# Patient Record
Sex: Female | Born: 1965 | Race: Black or African American | Hispanic: No | Marital: Married | Smoking: Never smoker
Health system: Southern US, Community
[De-identification: ages and names within clinical notes are randomized; demographics above are authoritative.]

## PROBLEM LIST (undated history)

## (undated) DIAGNOSIS — H409 Unspecified glaucoma: Secondary | ICD-10-CM

## (undated) DIAGNOSIS — Z90721 Acquired absence of ovaries, unilateral: Secondary | ICD-10-CM

## (undated) DIAGNOSIS — E7800 Pure hypercholesterolemia, unspecified: Secondary | ICD-10-CM

## (undated) DIAGNOSIS — N80129 Deep endometriosis of ovary, unspecified ovary: Secondary | ICD-10-CM

## (undated) DIAGNOSIS — N920 Excessive and frequent menstruation with regular cycle: Secondary | ICD-10-CM

## (undated) DIAGNOSIS — Z9289 Personal history of other medical treatment: Secondary | ICD-10-CM

## (undated) DIAGNOSIS — E669 Obesity, unspecified: Secondary | ICD-10-CM

## (undated) DIAGNOSIS — I10 Essential (primary) hypertension: Secondary | ICD-10-CM

## (undated) DIAGNOSIS — F4321 Adjustment disorder with depressed mood: Secondary | ICD-10-CM

## (undated) DIAGNOSIS — R55 Syncope and collapse: Secondary | ICD-10-CM

## (undated) HISTORY — PX: WISDOM TOOTH EXTRACTION: SHX21

## (undated) HISTORY — DX: Acquired absence of ovaries, unilateral: Z90.721

## (undated) HISTORY — DX: Obesity, unspecified: E66.9

## (undated) HISTORY — DX: Unspecified glaucoma: H40.9

## (undated) HISTORY — DX: Adjustment disorder with depressed mood: F43.21

## (undated) HISTORY — DX: Excessive and frequent menstruation with regular cycle: N92.0

## (undated) HISTORY — PX: TUBAL LIGATION: SHX77

## (undated) HISTORY — PX: TOE SURGERY: SHX1073

## (undated) HISTORY — DX: Pure hypercholesterolemia, unspecified: E78.00

## (undated) HISTORY — DX: Syncope and collapse: R55

## (undated) HISTORY — DX: Deep endometriosis of ovary, unspecified ovary: N80.129

---

## 1990-07-27 DIAGNOSIS — Z9289 Personal history of other medical treatment: Secondary | ICD-10-CM

## 1990-07-27 HISTORY — DX: Personal history of other medical treatment: Z92.89

## 1998-03-15 ENCOUNTER — Encounter: Admission: RE | Admit: 1998-03-15 | Discharge: 1998-03-15 | Payer: Self-pay | Admitting: *Deleted

## 1999-04-04 ENCOUNTER — Other Ambulatory Visit: Admission: RE | Admit: 1999-04-04 | Discharge: 1999-04-04 | Payer: Self-pay | Admitting: Obstetrics & Gynecology

## 1999-04-12 ENCOUNTER — Emergency Department (HOSPITAL_COMMUNITY): Admission: EM | Admit: 1999-04-12 | Discharge: 1999-04-12 | Payer: Self-pay | Admitting: Emergency Medicine

## 2000-04-16 ENCOUNTER — Other Ambulatory Visit: Admission: RE | Admit: 2000-04-16 | Discharge: 2000-04-16 | Payer: Self-pay | Admitting: Obstetrics & Gynecology

## 2001-04-15 ENCOUNTER — Other Ambulatory Visit: Admission: RE | Admit: 2001-04-15 | Discharge: 2001-04-15 | Payer: Self-pay | Admitting: Obstetrics and Gynecology

## 2001-04-18 ENCOUNTER — Other Ambulatory Visit: Admission: RE | Admit: 2001-04-18 | Discharge: 2001-04-18 | Payer: Self-pay | Admitting: Obstetrics and Gynecology

## 2001-09-13 ENCOUNTER — Emergency Department (HOSPITAL_COMMUNITY): Admission: EM | Admit: 2001-09-13 | Discharge: 2001-09-13 | Payer: Self-pay | Admitting: Emergency Medicine

## 2002-05-15 ENCOUNTER — Other Ambulatory Visit: Admission: RE | Admit: 2002-05-15 | Discharge: 2002-05-15 | Payer: Self-pay | Admitting: Obstetrics and Gynecology

## 2002-11-01 ENCOUNTER — Other Ambulatory Visit: Admission: RE | Admit: 2002-11-01 | Discharge: 2002-11-01 | Payer: Self-pay | Admitting: Obstetrics and Gynecology

## 2003-08-14 ENCOUNTER — Other Ambulatory Visit: Admission: RE | Admit: 2003-08-14 | Discharge: 2003-08-14 | Payer: Self-pay | Admitting: Obstetrics and Gynecology

## 2003-12-06 ENCOUNTER — Ambulatory Visit (HOSPITAL_COMMUNITY): Admission: RE | Admit: 2003-12-06 | Discharge: 2003-12-06 | Payer: Self-pay | Admitting: Obstetrics and Gynecology

## 2004-12-29 ENCOUNTER — Other Ambulatory Visit: Admission: RE | Admit: 2004-12-29 | Discharge: 2004-12-29 | Payer: Self-pay | Admitting: Obstetrics and Gynecology

## 2006-02-11 ENCOUNTER — Other Ambulatory Visit: Admission: RE | Admit: 2006-02-11 | Discharge: 2006-02-11 | Payer: Self-pay | Admitting: Obstetrics and Gynecology

## 2013-07-09 DIAGNOSIS — S91309A Unspecified open wound, unspecified foot, initial encounter: Secondary | ICD-10-CM | POA: Insufficient documentation

## 2013-07-09 DIAGNOSIS — Z79899 Other long term (current) drug therapy: Secondary | ICD-10-CM | POA: Insufficient documentation

## 2013-07-09 DIAGNOSIS — Y838 Other surgical procedures as the cause of abnormal reaction of the patient, or of later complication, without mention of misadventure at the time of the procedure: Secondary | ICD-10-CM | POA: Insufficient documentation

## 2013-07-09 DIAGNOSIS — Z792 Long term (current) use of antibiotics: Secondary | ICD-10-CM | POA: Insufficient documentation

## 2013-07-09 DIAGNOSIS — L02619 Cutaneous abscess of unspecified foot: Secondary | ICD-10-CM | POA: Insufficient documentation

## 2013-07-10 ENCOUNTER — Encounter (HOSPITAL_COMMUNITY): Payer: Self-pay | Admitting: Emergency Medicine

## 2013-07-10 ENCOUNTER — Emergency Department (HOSPITAL_COMMUNITY)
Admission: EM | Admit: 2013-07-10 | Discharge: 2013-07-10 | Disposition: A | Discharge status: Home / Self Care | Payer: BC Managed Care – PPO | Attending: Emergency Medicine | Admitting: Emergency Medicine

## 2013-07-10 DIAGNOSIS — L0390 Cellulitis, unspecified: Secondary | ICD-10-CM

## 2013-07-10 MED ORDER — SULFAMETHOXAZOLE-TMP DS 800-160 MG PO TABS: 1.0000 | ORAL_TABLET | Freq: Two times a day (BID) | ORAL | Status: DC

## 2013-07-10 MED ORDER — OXYCODONE-ACETAMINOPHEN 5-325 MG PO TABS
1.0000 | ORAL_TABLET | Freq: Once | ORAL | Status: DC
  Filled 2013-07-10: qty 1

## 2013-07-10 MED ORDER — SULFAMETHOXAZOLE-TMP DS 800-160 MG PO TABS
1.0000 | ORAL_TABLET | Freq: Once | ORAL | Status: AC
  Administered 2013-07-10: 1 via ORAL
  Filled 2013-07-10: qty 1

## 2013-07-10 NOTE — ED Provider Notes (Signed)
CSN: [id number]     Arrival date & time 07/09/13  2309 History   First MD Initiated Contact with Patient 07/10/13 0046     Chief Complaint  Patient presents with  . Post-op Problem   (Consider location/radiation/quality/duration/timing/severity/associated sxs/prior Treatment) HPI Comments: She had an ALT of her right podiatrist place she was put on Vicodin he she states when she took the dressing off from on Friday she noted some blood on the dressing and the wound looked more swollen. Denies any fever myalgias. She states she was not instructed to stay off the foot has been working fine he she doesn't think she can go to work tomorrow due to the pain    The history is provided by the patient.    History reviewed. No pertinent past medical history. History reviewed. No pertinent past surgical history. History reviewed. No pertinent family history. History  Substance Use Topics  . Smoking status: Never Smoker   . Smokeless tobacco: Not on file  . Alcohol Use: No   OB History   Grav Para Term Preterm Abortions TAB SAB Ect Mult Living                 Review of Systems  Constitutional: Negative for fever.  Cardiovascular: Negative for leg swelling.  Musculoskeletal: Negative for joint swelling.  Skin: Positive for wound.  All other systems reviewed and are negative.    Allergies  Review of patient's allergies indicates no known allergies.  Home Medications   Current Outpatient Rx  Name  Route  Sig  Dispense  Refill  . cephALEXin (KEFLEX) 500 MG capsule   Oral   Take 500 mg by mouth 2 (two) times daily.         Marland Kitchen HYDROcodone-acetaminophen (NORCO/VICODIN) 5-325 MG per tablet   Oral   Take 1 tablet by mouth every 6 (six) hours as needed for moderate pain. Do not take with Oxycodone         . oxyCODONE-acetaminophen (PERCOCET) 7.5-325 MG per tablet   Oral   Take 1 tablet by mouth every 6 (six) hours as needed for pain. Do not take with Hydrocodone         .  promethazine (PHENERGAN) 25 MG tablet   Oral   Take 1 tablet by mouth 4 (four) times daily as needed for nausea.          Marland Kitchen sulfamethoxazole-trimethoprim (BACTRIM DS) 800-160 MG per tablet   Oral   Take 1 tablet by mouth 2 (two) times daily.          BP 161/102  Pulse 82  Temp(Src) 98.3 F (36.8 C) (Oral)  Resp 16  SpO2 98%  LMP 07/05/2013 Physical Exam  Nursing note and vitals reviewed. Constitutional: She appears well-developed and well-nourished.  HENT:  Head: Normocephalic.  Eyes: Pupils are equal, round, and reactive to light.  Neck: Normal range of motion.  Cardiovascular: Normal rate.   Pulmonary/Chest: Effort normal.  Musculoskeletal: Normal range of motion. She exhibits edema and tenderness.  Neurological: She is alert.  Skin: Skin is warm. There is erythema.    ED Course  Procedures (including critical care time) Labs Review Labs Reviewed - No data to display Imaging Review No results found.  EKG Interpretation   None      Bedside ultrasound appears to be cellulitis  Will add Septra and to finish Kefex and FU with Podiatrist  MDM  No diagnosis found.        Cipriano Mile  Manus Rudd, NP 07/10/13 0157

## 2013-07-10 NOTE — ED Provider Notes (Signed)
Medical screening examination/treatment/procedure(s) were conducted as a shared visit with non-physician practitioner(s) and myself.  I personally evaluated the patient during the encounter.  EKG Interpretation   None       Pt without signs of persistent abscess on bedside u/s.  Pt does have some cellulitis and feel she need mrsa coverage with bactrim as well as continueing her kelfex  Gwyneth Sprout, MD 07/10/13 [phone]

## 2013-07-10 NOTE — ED Notes (Signed)
Pt arrived to the ED with a complaint of foot pain.  Pt has a cyst lanced on Friday.  Pt states she didn't elevate the foot and continued to use the foot as normal.  Pt noticed pain and swelling earlier on Sunday.

## 2015-10-24 ENCOUNTER — Other Ambulatory Visit: Payer: Self-pay | Admitting: Obstetrics and Gynecology

## 2015-10-24 ENCOUNTER — Other Ambulatory Visit (HOSPITAL_COMMUNITY)
Admission: RE | Admit: 2015-10-24 | Discharge: 2015-10-24 | Disposition: A | Discharge status: Home / Self Care | Payer: BC Managed Care – PPO | Source: Ambulatory Visit | Attending: Obstetrics and Gynecology | Admitting: Obstetrics and Gynecology

## 2015-10-24 DIAGNOSIS — Z1151 Encounter for screening for human papillomavirus (HPV): Secondary | ICD-10-CM | POA: Diagnosis not present

## 2015-10-24 DIAGNOSIS — Z01419 Encounter for gynecological examination (general) (routine) without abnormal findings: Secondary | ICD-10-CM | POA: Insufficient documentation

## 2015-10-28 LAB — CYTOLOGY - PAP

## 2016-01-24 NOTE — Patient Instructions (Addendum)
Your procedure is scheduled on:  Friday, February 07, 2016  Enter through the Main Entrance of [facility] at: 10:00 AM  Pick up the phone at the desk and dial [phone].  Call this number if you have problems the morning of surgery: [phone].  Remember: Do NOT eat food or drink after:  Midnight Thursday, February 06, 2016  Take these medicines the morning of surgery with a SIP OF WATER:  Triamterene  Do NOT wear jewelry (body piercing), metal hair clips/bobby pins, make-up, or nail polish. Do NOT wear lotions, powders, or perfumes.  You may wear deodorant. Do NOT shave for 48 hours prior to surgery. Do NOT bring valuables to the hospital.  Leave suitcase in car.  After surgery it may be brought to your room.  For patients admitted to the hospital, checkout time is 11:00 AM the day of discharge. Home with husband Kazzem cell [phone].

## 2016-01-27 ENCOUNTER — Encounter (HOSPITAL_COMMUNITY): Payer: Self-pay

## 2016-01-27 ENCOUNTER — Encounter (HOSPITAL_COMMUNITY)
Admission: RE | Admit: 2016-01-27 | Discharge: 2016-01-27 | Disposition: A | Discharge status: Home / Self Care | Payer: BC Managed Care – PPO | Source: Ambulatory Visit | Attending: Obstetrics and Gynecology | Admitting: Obstetrics and Gynecology

## 2016-01-27 ENCOUNTER — Other Ambulatory Visit: Payer: Self-pay

## 2016-01-27 DIAGNOSIS — Z01812 Encounter for preprocedural laboratory examination: Secondary | ICD-10-CM | POA: Diagnosis not present

## 2016-01-27 HISTORY — DX: Essential (primary) hypertension: I10

## 2016-01-27 HISTORY — DX: Personal history of other medical treatment: Z92.89

## 2016-01-27 LAB — BASIC METABOLIC PANEL
Anion gap: 7 (ref 5–15)
BUN: 21 mg/dL — AB (ref 6–20)
CALCIUM: 8.9 mg/dL (ref 8.9–10.3)
CO2: 27 mmol/L (ref 22–32)
CREATININE: 0.9 mg/dL (ref 0.44–1.00)
Chloride: 105 mmol/L (ref 101–111)
GFR calc Af Amer: >60 mL/min (ref >60)
GLUCOSE: 118 mg/dL — AB (ref 65–99)
Potassium: 3.2 mmol/L — ABNORMAL LOW (ref 3.5–5.1)
Sodium: 139 mmol/L (ref 135–145)

## 2016-01-27 LAB — CBC
HCT: 37.6 % (ref 36.0–46.0)
Hemoglobin: 12.6 g/dL (ref 12.0–15.0)
MCH: 30.9 pg (ref 26.0–34.0)
MCHC: 33.5 g/dL (ref 30.0–36.0)
MCV: 92.2 fL (ref 78.0–100.0)
PLATELETS: 307 10*3/uL (ref 150–400)
RBC: 4.08 MIL/uL (ref 3.87–5.11)
RDW: 13.4 % (ref 11.5–15.5)
WBC: 6.7 10*3/uL (ref 4.0–10.5)

## 2016-01-27 NOTE — Pre-Procedure Instructions (Signed)
SDS BB History Log given to Lab for previous blood transfusion in 1992, [place], [place] with vaginal delivery.

## 2016-02-07 ENCOUNTER — Ambulatory Visit (HOSPITAL_COMMUNITY): Payer: BC Managed Care – PPO | Admitting: Anesthesiology

## 2016-02-07 ENCOUNTER — Ambulatory Visit (HOSPITAL_COMMUNITY)
Admission: RE | Admit: 2016-02-07 | Discharge: 2016-02-07 | Disposition: A | Discharge status: Home / Self Care | Payer: BC Managed Care – PPO | Source: Ambulatory Visit | Attending: Obstetrics and Gynecology | Admitting: Obstetrics and Gynecology

## 2016-02-07 ENCOUNTER — Encounter (HOSPITAL_COMMUNITY)
Admission: RE | Disposition: A | Discharge status: Home / Self Care | Payer: Self-pay | Source: Ambulatory Visit | Attending: Obstetrics and Gynecology

## 2016-02-07 ENCOUNTER — Encounter (HOSPITAL_COMMUNITY): Payer: Self-pay

## 2016-02-07 DIAGNOSIS — Z6836 Body mass index (BMI) 36.0-36.9, adult: Secondary | ICD-10-CM | POA: Diagnosis not present

## 2016-02-07 DIAGNOSIS — N920 Excessive and frequent menstruation with regular cycle: Secondary | ICD-10-CM | POA: Insufficient documentation

## 2016-02-07 DIAGNOSIS — N801 Endometriosis of ovary: Secondary | ICD-10-CM | POA: Diagnosis not present

## 2016-02-07 DIAGNOSIS — I10 Essential (primary) hypertension: Secondary | ICD-10-CM | POA: Insufficient documentation

## 2016-02-07 DIAGNOSIS — N83201 Unspecified ovarian cyst, right side: Secondary | ICD-10-CM | POA: Insufficient documentation

## 2016-02-07 HISTORY — PX: SALPINGOOPHORECTOMY: SHX82

## 2016-02-07 HISTORY — PX: UNILATERAL SALPINGECTOMY: SHX6160

## 2016-02-07 HISTORY — PX: HYSTEROSCOPY: SHX211

## 2016-02-07 LAB — ABO/RH: ABO/RH(D): O POS

## 2016-02-07 LAB — TYPE AND SCREEN
ABO/RH(D): O POS
Antibody Screen: NEGATIVE

## 2016-02-07 LAB — PREGNANCY, URINE: PREG TEST UR: NEGATIVE

## 2016-02-07 SURGERY — ABLATION, ENDOMETRIUM, HYSTEROSCOPIC
Anesthesia: General | Site: Abdomen | Laterality: Right

## 2016-02-07 MED ORDER — LACTATED RINGERS IV SOLN
INTRAVENOUS | Status: DC
  Administered 2016-02-07: 125 mL/h via INTRAVENOUS
  Administered 2016-02-07 (×2): via INTRAVENOUS

## 2016-02-07 MED ORDER — LIDOCAINE HCL 2 % IJ SOLN
INTRAMUSCULAR | Status: AC
  Filled 2016-02-07: qty 20

## 2016-02-07 MED ORDER — HEPARIN SODIUM (PORCINE) 5000 UNIT/ML IJ SOLN
INTRAMUSCULAR | Status: AC
  Filled 2016-02-07: qty 1

## 2016-02-07 MED ORDER — OXYCODONE-ACETAMINOPHEN 5-325 MG PO TABS
1.0000 | ORAL_TABLET | ORAL | Status: DC | PRN
  Administered 2016-02-07: 1 via ORAL

## 2016-02-07 MED ORDER — IBUPROFEN 600 MG PO TABS: 600.0000 mg | ORAL_TABLET | Freq: Four times a day (QID) | ORAL | Status: AC | PRN

## 2016-02-07 MED ORDER — MIDAZOLAM HCL 2 MG/2ML IJ SOLN
INTRAMUSCULAR | Status: AC
  Filled 2016-02-07: qty 2

## 2016-02-07 MED ORDER — FENTANYL CITRATE (PF) 100 MCG/2ML IJ SOLN
INTRAMUSCULAR | Status: AC
  Filled 2016-02-07: qty 2

## 2016-02-07 MED ORDER — CEFAZOLIN SODIUM-DEXTROSE 2-4 GM/100ML-% IV SOLN
2.0000 g | INTRAVENOUS | Status: AC
  Administered 2016-02-07: 2 g via INTRAVENOUS

## 2016-02-07 MED ORDER — DEXAMETHASONE SODIUM PHOSPHATE 4 MG/ML IJ SOLN
INTRAMUSCULAR | Status: AC
  Filled 2016-02-07: qty 1

## 2016-02-07 MED ORDER — PROMETHAZINE HCL 25 MG/ML IJ SOLN: 6.2500 mg | INTRAMUSCULAR | Status: DC | PRN

## 2016-02-07 MED ORDER — MIDAZOLAM HCL 2 MG/2ML IJ SOLN: 0.5000 mg | Freq: Once | INTRAMUSCULAR | Status: DC | PRN

## 2016-02-07 MED ORDER — PROPOFOL 10 MG/ML IV BOLUS
INTRAVENOUS | Status: DC | PRN
Start: 2016-02-07 — End: 2016-02-07
  Administered 2016-02-07: 200 mg via INTRAVENOUS

## 2016-02-07 MED ORDER — DEXAMETHASONE SODIUM PHOSPHATE 10 MG/ML IJ SOLN
INTRAMUSCULAR | Status: DC | PRN
  Administered 2016-02-07: 4 mg via INTRAVENOUS

## 2016-02-07 MED ORDER — DEXAMETHASONE SODIUM PHOSPHATE 10 MG/ML IJ SOLN
INTRAMUSCULAR | Status: AC
  Filled 2016-02-07: qty 1

## 2016-02-07 MED ORDER — MIDAZOLAM HCL 2 MG/2ML IJ SOLN
INTRAMUSCULAR | Status: DC | PRN
  Administered 2016-02-07: 2 mg via INTRAVENOUS

## 2016-02-07 MED ORDER — ROCURONIUM BROMIDE 100 MG/10ML IV SOLN
INTRAVENOUS | Status: AC
  Filled 2016-02-07: qty 1

## 2016-02-07 MED ORDER — BUPIVACAINE HCL (PF) 0.25 % IJ SOLN
INTRAMUSCULAR | Status: DC | PRN
  Administered 2016-02-07: 28 mL

## 2016-02-07 MED ORDER — FENTANYL CITRATE (PF) 250 MCG/5ML IJ SOLN
INTRAMUSCULAR | Status: AC
  Filled 2016-02-07: qty 5

## 2016-02-07 MED ORDER — NEOSTIGMINE METHYLSULFATE 10 MG/10ML IV SOLN
INTRAVENOUS | Status: DC | PRN
  Administered 2016-02-07: 2 mg via INTRAVENOUS

## 2016-02-07 MED ORDER — ONDANSETRON HCL 4 MG/2ML IJ SOLN
INTRAMUSCULAR | Status: AC
  Filled 2016-02-07: qty 2

## 2016-02-07 MED ORDER — FENTANYL CITRATE (PF) 100 MCG/2ML IJ SOLN
INTRAMUSCULAR | Status: DC | PRN
  Administered 2016-02-07: 50 ug via INTRAVENOUS
  Administered 2016-02-07: 100 ug via INTRAVENOUS
  Administered 2016-02-07 (×2): 50 ug via INTRAVENOUS
  Administered 2016-02-07: 100 ug via INTRAVENOUS

## 2016-02-07 MED ORDER — PHENYLEPHRINE HCL 10 MG/ML IJ SOLN
INTRAMUSCULAR | Status: DC | PRN
  Administered 2016-02-07: 80 ug via INTRAVENOUS

## 2016-02-07 MED ORDER — ONDANSETRON HCL 4 MG/2ML IJ SOLN
INTRAMUSCULAR | Status: DC | PRN
  Administered 2016-02-07: 4 mg via INTRAVENOUS

## 2016-02-07 MED ORDER — SCOPOLAMINE 1 MG/3DAYS TD PT72
MEDICATED_PATCH | TRANSDERMAL | Status: AC
  Administered 2016-02-07: 1.5 mg via TRANSDERMAL
  Filled 2016-02-07: qty 1

## 2016-02-07 MED ORDER — NEOSTIGMINE METHYLSULFATE 10 MG/10ML IV SOLN
INTRAVENOUS | Status: AC
  Filled 2016-02-07: qty 1

## 2016-02-07 MED ORDER — GLYCOPYRROLATE 0.2 MG/ML IJ SOLN
INTRAMUSCULAR | Status: DC | PRN
  Administered 2016-02-07: 0.4 mg via INTRAVENOUS

## 2016-02-07 MED ORDER — SILVER NITRATE-POT NITRATE 75-25 % EX MISC
CUTANEOUS | Status: AC
  Filled 2016-02-07: qty 1

## 2016-02-07 MED ORDER — SODIUM CHLORIDE 0.9 % IJ SOLN
INTRAMUSCULAR | Status: AC
  Filled 2016-02-07: qty 150

## 2016-02-07 MED ORDER — BUPIVACAINE HCL (PF) 0.25 % IJ SOLN
INTRAMUSCULAR | Status: AC
  Filled 2016-02-07: qty 30

## 2016-02-07 MED ORDER — MEPERIDINE HCL 25 MG/ML IJ SOLN: 6.2500 mg | INTRAMUSCULAR | Status: DC | PRN

## 2016-02-07 MED ORDER — OXYCODONE-ACETAMINOPHEN 5-325 MG PO TABS: 1.0000 | ORAL_TABLET | ORAL | Status: AC | PRN

## 2016-02-07 MED ORDER — HEPARIN SODIUM (PORCINE) 5000 UNIT/ML IJ SOLN
INTRAMUSCULAR | Status: DC | PRN
Start: 2016-02-07 — End: 2016-02-07
  Administered 2016-02-07: 5000 [IU]

## 2016-02-07 MED ORDER — LIDOCAINE HCL (CARDIAC) 20 MG/ML IV SOLN
INTRAVENOUS | Status: AC
  Filled 2016-02-07: qty 5

## 2016-02-07 MED ORDER — PROPOFOL 10 MG/ML IV BOLUS
INTRAVENOUS | Status: AC
  Filled 2016-02-07: qty 20

## 2016-02-07 MED ORDER — KETOROLAC TROMETHAMINE 30 MG/ML IJ SOLN
INTRAMUSCULAR | Status: AC
  Filled 2016-02-07: qty 1

## 2016-02-07 MED ORDER — LACTATED RINGERS IR SOLN
Status: DC | PRN
  Administered 2016-02-07: 3000 mL

## 2016-02-07 MED ORDER — VASOPRESSIN 20 UNIT/ML IV SOLN
INTRAVENOUS | Status: AC
  Filled 2016-02-07: qty 1

## 2016-02-07 MED ORDER — ROCURONIUM BROMIDE 100 MG/10ML IV SOLN
INTRAVENOUS | Status: DC | PRN
Start: 2016-02-07 — End: 2016-02-07
  Administered 2016-02-07: 40 mg via INTRAVENOUS
  Administered 2016-02-07 (×2): 10 mg via INTRAVENOUS

## 2016-02-07 MED ORDER — GLYCOPYRROLATE 0.2 MG/ML IJ SOLN
INTRAMUSCULAR | Status: AC
  Filled 2016-02-07: qty 2

## 2016-02-07 MED ORDER — LIDOCAINE HCL 2 % IJ SOLN
INTRAMUSCULAR | Status: DC | PRN
  Administered 2016-02-07: 10 mL

## 2016-02-07 MED ORDER — OXYCODONE-ACETAMINOPHEN 5-325 MG PO TABS
ORAL_TABLET | ORAL | Status: AC
  Filled 2016-02-07: qty 1

## 2016-02-07 MED ORDER — KETOROLAC TROMETHAMINE 30 MG/ML IJ SOLN
INTRAMUSCULAR | Status: DC | PRN
  Administered 2016-02-07: 30 mg via INTRAVENOUS

## 2016-02-07 MED ORDER — HYDROMORPHONE HCL 1 MG/ML IJ SOLN: 0.2500 mg | INTRAMUSCULAR | Status: DC | PRN

## 2016-02-07 MED ORDER — SCOPOLAMINE 1 MG/3DAYS TD PT72
1.0000 | MEDICATED_PATCH | Freq: Once | TRANSDERMAL | Status: DC
  Administered 2016-02-07: 1.5 mg via TRANSDERMAL

## 2016-02-07 MED ORDER — LIDOCAINE HCL (CARDIAC) 20 MG/ML IV SOLN
INTRAVENOUS | Status: DC | PRN
  Administered 2016-02-07: 60 mg via INTRAVENOUS

## 2016-02-07 SURGICAL SUPPLY — 59 items
APL SKNCLS STERI-STRIP NONHPOA (GAUZE/BANDAGES/DRESSINGS) ×2
APL SRG 38 LTWT LNG FL B (MISCELLANEOUS) ×3
APPLICATOR ARISTA FLEXITIP XL (MISCELLANEOUS) ×5 IMPLANT
BARRIER ADHS 3X4 INTERCEED (GAUZE/BANDAGES/DRESSINGS) IMPLANT
BENZOIN TINCTURE PRP APPL 2/3 (GAUZE/BANDAGES/DRESSINGS) ×5 IMPLANT
BRR ADH 4X3 ABS CNTRL BYND (GAUZE/BANDAGES/DRESSINGS)
CANISTER SUCT 3000ML (MISCELLANEOUS) ×5 IMPLANT
CATH ROBINSON RED A/P 16FR (CATHETERS) IMPLANT
CLOTH BEACON ORANGE TIMEOUT ST (SAFETY) ×5 IMPLANT
CONT PATH 16OZ SNAP LID 3702 (MISCELLANEOUS) ×5 IMPLANT
CONTAINER PREFILL 10% NBF 60ML (FORM) ×5 IMPLANT
COVER BACK TABLE 60X90IN (DRAPES) ×5 IMPLANT
DECANTER SPIKE VIAL GLASS SM (MISCELLANEOUS) ×10 IMPLANT
DILATOR CANAL MILEX (MISCELLANEOUS) IMPLANT
DRSG COVADERM PLUS 2X2 (GAUZE/BANDAGES/DRESSINGS) ×10 IMPLANT
DRSG OPSITE POSTOP 3X4 (GAUZE/BANDAGES/DRESSINGS) ×10 IMPLANT
DURAPREP 26ML APPLICATOR (WOUND CARE) ×5 IMPLANT
ELECT LIGASURE LONG (ELECTRODE) IMPLANT
ELECT LIGASURE SHORT 9 REUSE (ELECTRODE) IMPLANT
ELECT REM PT RETURN 9FT ADLT (ELECTROSURGICAL)
ELECTRODE REM PT RTRN 9FT ADLT (ELECTROSURGICAL) IMPLANT
GLOVE BIO SURGEON STRL SZ 6.5 (GLOVE) ×16 IMPLANT
GLOVE BIO SURGEON STRL SZ7 (GLOVE) ×10 IMPLANT
GLOVE BIO SURGEONS STRL SZ 6.5 (GLOVE) ×4
GLOVE BIOGEL PI IND STRL 7.0 (GLOVE) ×15 IMPLANT
GLOVE BIOGEL PI IND STRL 7.5 (GLOVE) ×18 IMPLANT
GLOVE BIOGEL PI INDICATOR 7.0 (GLOVE) ×10
GLOVE BIOGEL PI INDICATOR 7.5 (GLOVE) ×12
GLOVE ECLIPSE 6.0 STRL STRAW (GLOVE) ×5 IMPLANT
GLOVE SURG SS PI 7.0 STRL IVOR (GLOVE) ×20 IMPLANT
GOWN STRL REUS W/TWL LRG LVL3 (GOWN DISPOSABLE) ×10 IMPLANT
HEMOSTAT ARISTA ABSORB 3G PWDR (MISCELLANEOUS) ×5 IMPLANT
LEGGING LITHOTOMY PAIR STRL (DRAPES) ×5 IMPLANT
LIQUID BAND (GAUZE/BANDAGES/DRESSINGS) ×5 IMPLANT
NS IRRIG 1000ML POUR BTL (IV SOLUTION) ×5 IMPLANT
PACK LAVH (CUSTOM PROCEDURE TRAY) ×5 IMPLANT
PACK ROBOTIC GOWN (GOWN DISPOSABLE) ×5 IMPLANT
PACK VAGINAL MINOR WOMEN LF (CUSTOM PROCEDURE TRAY) ×5 IMPLANT
PAD OB MATERNITY 4.3X12.25 (PERSONAL CARE ITEMS) ×10 IMPLANT
PAD TRENDELENBURG POSITION (MISCELLANEOUS) ×5 IMPLANT
SET GENESYS HTA PROCERVA (MISCELLANEOUS) ×5 IMPLANT
SET IRRIG TUBING LAPAROSCOPIC (IRRIGATION / IRRIGATOR) ×5 IMPLANT
SHEARS HARMONIC ACE PLUS 36CM (ENDOMECHANICALS) ×5 IMPLANT
SLEEVE XCEL OPT CAN 5 100 (ENDOMECHANICALS) ×10 IMPLANT
SUT CHROMIC 2 0 SH (SUTURE) IMPLANT
SUT CHROMIC 2 0 UR 5 27 (SUTURE) IMPLANT
SUT MNCRL AB 4-0 PS2 18 (SUTURE) ×5 IMPLANT
SUT MON AB 4-0 PS1 27 (SUTURE) ×5 IMPLANT
SUT VIC AB 0 CT1 18XCR BRD8 (SUTURE) IMPLANT
SUT VIC AB 0 CT1 36 (SUTURE) IMPLANT
SUT VIC AB 0 CT1 8-18 (SUTURE)
SUT VIC AB 2-0 CT1 (SUTURE) IMPLANT
SUT VICRYL 0 UR6 27IN ABS (SUTURE) ×10 IMPLANT
SUT VICRYL 1 TIES 12X18 (SUTURE) IMPLANT
SYR TB 1ML 25GX5/8 (SYRINGE) ×5 IMPLANT
TOWEL OR 17X24 6PK STRL BLUE (TOWEL DISPOSABLE) ×10 IMPLANT
TRAY FOLEY CATH SILVER 14FR (SET/KITS/TRAYS/PACK) ×5 IMPLANT
WARMER LAPAROSCOPE (MISCELLANEOUS) IMPLANT
WATER STERILE IRR 1000ML POUR (IV SOLUTION) ×5 IMPLANT

## 2016-02-07 NOTE — Transfer of Care (Signed)
Immediate Anesthesia Transfer of Care Note  Patient: Denise Duarte  Procedure(s) Performed: Procedure(s) with comments: HYSTEROSCOPY WITH HYDROTHERMAL ABLATION (N/A) - IF FROZEN COMES BACK WITH CANCER WILL DO LAVH IF NOT HYST.HTA RIGHT SALPINGO OOPHORECTOMY WITH FROZEN SECTION (Right) UNILATERAL SALPINGECTOMY (Left)  Patient Location: PACU  Anesthesia Type:General  Level of Consciousness: awake, alert , oriented and patient cooperative  Airway & Oxygen Therapy: Patient Spontanous Breathing and Patient connected to nasal cannula oxygen  Post-op Assessment: Report given to RN and Post -op Vital signs reviewed and stable  Post vital signs: Reviewed and stable  Last Vitals:  TEMP 99.1 BP 129/84 HR 79 RR 18 POX 98  Last Pain: There were no vitals filed for this visit.    Patients Stated Pain Goal: 1 (02/07/16 1012)  Complications: No apparent anesthesia complications

## 2016-02-07 NOTE — Discharge Instructions (Addendum)
Diagnostic Laparoscopy A diagnostic laparoscopy is a procedure to diagnose diseases in the abdomen. During the procedure, a thin, lighted, pencil-sized instrument called a laparoscope is inserted into the abdomen through an incision. The laparoscope allows your health care provider to look at the organs inside your body. LET [facility] CARE PROVIDER KNOW ABOUT:  Any allergies you have.  All medicines you are taking, including vitamins, herbs, eye drops, creams, and over-the-counter medicines.  Previous problems you or members of your family have had with the use of anesthetics.  Any blood disorders you have.  Previous surgeries you have had.  Medical conditions you have. RISKS AND COMPLICATIONS  Generally, this is a safe procedure. However, problems can occur, which may include:  Infection.  Bleeding.  Damage to other organs.  Allergic reaction to the anesthetics used during the procedure. BEFORE THE PROCEDURE  Do not eat or drink anything after midnight on the night before the procedure or as directed by your health care provider.  Ask your health care provider about:  Changing or stopping your regular medicines.  Taking medicines such as aspirin and ibuprofen. These medicines can thin your blood. Do not take these medicines before your procedure if your health care provider instructs you not to.  Plan to have someone take you home after the procedure. PROCEDURE  You may be given a medicine to help you relax (sedative).  You will be given a medicine to make you sleep (general anesthetic).  Your abdomen will be inflated with a gas. This will make your organs easier to see.  Small incisions will be made in your abdomen.  A laparoscope and other small instruments will be inserted into the abdomen through the incisions.  A tissue sample may be removed from an organ in the abdomen for examination.  The instruments will be removed from the abdomen.  The gas will be  released.  The incisions will be closed with stitches (sutures). AFTER THE PROCEDURE  Your blood pressure, heart rate, breathing rate, and blood oxygen level will be monitored often until the medicines you were given have worn off.   This information is not intended to replace advice given to you by your health care provider. Make sure you discuss any questions you have with your health care provider.   Document Released: 10/19/2000 Document Revised: 04/03/2015 Document Reviewed: 02/23/2014 Elsevier Interactive Patient Education 2016 Elsevier Inc[employer]  Endometrial Ablation Endometrial ablation removes the lining of the uterus (endometrium). It is usually a same-day, outpatient treatment. Ablation helps avoid major surgery, such as surgery to remove the cervix and uterus (hysterectomy). After endometrial ablation, you will have little or no menstrual bleeding and may not be able to have children. However, if you are premenopausal, you will need to use a reliable method of birth control following the procedure because of the small chance that pregnancy can occur. There are different reasons to have this procedure. These reasons include:  Heavy periods.  Bleeding that is causing anemia.  Irregular bleeding.  Bleeding fibroids on the lining inside the uterus if they are smaller than 3 centimeters. This procedure may not be possible for you if:   You want to have children in the future.   You have severe cramps with your menstrual period.   You have precancerous or cancerous cells in your uterus.   You were recently pregnant.   You have gone through menopause.   You have had major surgery on your uterus, resulting in thinning of the uterine  wall. Surgeries may include:  The removal of one or more uterine fibroids (myomectomy).  A cesarean section with a classic (vertical) incision on your uterus. Ask your health care provider what type of cesarean you had. Sometimes the scar on  your skin is different than the scar on your uterus. Even if you have had surgery on your uterus, certain types of ablation may still be safe for you. Talk with your health care provider. [facility] KNOW ABOUT:  Any allergies you have.  All medicines you are taking, including vitamins, herbs, eye drops, creams, and over-the-counter medicines.  Previous problems you or members of your family have had with the use of anesthetics.  Any blood disorders you have.  Previous surgeries you have had.  Medical conditions you have. RISKS AND COMPLICATIONS  Generally, this is a safe procedure. However, as with any procedure, complications can occur. Possible complications include:  Perforation of the uterus.  Bleeding.  Infection of the uterus, bladder, or vagina.  Injury to surrounding organs.  An air bubble to the lung (air embolus).  Pregnancy following the procedure.  Failure of the procedure to help the problem, requiring hysterectomy.  Decreased ability to diagnose cancer in the lining of the uterus. BEFORE THE PROCEDURE  The lining of the uterus must be tested to make sure there is no pre-cancerous or cancer cells present.  An ultrasound may be performed to look at the size of the uterus and to check for abnormalities.  Medicines may be given to thin the lining of the uterus. PROCEDURE  During the procedure, your health care provider will use a tool called a resectoscope to help see inside your uterus. There are different ways to remove the lining of your uterus.   Radiofrequency - This method uses a radiofrequency-alternating electric current to remove the lining of the uterus.  Cryotherapy - This method uses extreme cold to freeze the lining of the uterus.  Heated-Free Liquid - This method uses heated salt (saline) solution to remove the lining of the uterus.  Microwave - This method uses high-energy microwaves to heat up the lining of the uterus to  remove it.  Thermal balloon - This method involves inserting a catheter with a balloon tip into the uterus. The balloon tip is filled with heated fluid to remove the lining of the uterus. AFTER THE PROCEDURE  After your procedure, do not have sexual intercourse or insert anything into your vagina until permitted by your health care provider. After the procedure, you may experience:  Cramps.  Vaginal discharge.  Frequent urination.   This information is not intended to replace advice given to you by your health care provider. Make sure you discuss any questions you have with your health care provider.   Document Released: 05/22/2004 Document Revised: 04/03/2015 Document Reviewed: 12/14/2012 Elsevier Interactive Patient Education 2016 Elsevier Inc.  DISCHARGE INSTRUCTIONS: Laparoscopy  The following instructions have been prepared to help you care for yourself upon your return home today.  Wound care:  Do not get the incision wet for the first 24 hours. The incision should be kept clean and dry.  The Band-Aids or dressings may be removed the day after surgery.  Should the incision become sore, red, and swollen after the first week, check with your doctor.  Personal hygiene:  Shower the day after your procedure.  Activity and limitations:  Do NOT drive or operate any equipment today.  Do NOT lift anything more than 15 pounds for  2-3 weeks after surgery.  Do NOT rest in bed all day.  Walking is encouraged. Walk each day, starting slowly with 5-minute walks 3 or 4 times a day. Slowly increase the length of your walks.  Walk up and down stairs slowly.  Do NOT do strenuous activities, such as golfing, playing tennis, bowling, running, biking, weight lifting, gardening, mowing, or vacuuming for 2-4 weeks. Ask your doctor when it is okay to start.  Diet: Eat a light meal as desired this evening. You may resume your usual diet tomorrow.  Return to work: This is dependent on  the type of work you do. For the most part you can return to a desk job within a week of surgery. If you are more active at work, please discuss this with your doctor.  What to expect after your surgery: You may have a slight burning sensation when you urinate on the first day. You may have a very small amount of blood in the urine. Expect to have a small amount of vaginal discharge/light bleeding for 1-2 weeks. It is not unusual to have abdominal soreness and bruising for up to 2 weeks. You may be tired and need more rest for about 1 week. You may experience shoulder pain for 24-72 hours. Lying flat in bed may relieve it.  Call your doctor for any of the following:  Develop a fever of 100.4 or greater  Inability to urinate 6 hours after discharge from hospital  Severe pain not relieved by pain medications  Persistent of heavy bleeding at incision site  Redness or swelling around incision site after a week  Increasing nausea or vomiting  Patient Signature________________________________________ Nurse Signature_________________________________________

## 2016-02-07 NOTE — H&P (Signed)
History of Present Illness  General:  Pt with persistant right ovarian complex cyst. Elevated Ca-125 has increased slightly. ORCA score is low risk for malignancy. Pt has some mild discomfort in RLQ. Pt has a pulling sensation. Pt also has a h/o menorrhagia and has 2 small fibroids. She was originally consented for hysterectomy but does not want to have anything taken out that she does not have to. Agreeable to endometrial ablation if cyst is benign.   Current Medications  Taking   Biotin 2500 MCG Capsule 1 tablet Orally Once a day   Triamterene-HCTZ 37.5-25 MG Tablet 1 tablet Orally Once a day for BP   K-Dur 20 meq Tablet 1 tablet Orally once a day   Not-Taking/PRN   Lysteda(Tranexamic Acid) 650 MG Tablet 2 tablets Orally TID up to 5 days with menses   Medication List reviewed and reconciled with the patient    Past Medical History  Hypertension (2016)- Dr. Salena Saner.           Surgical History  toe surgery to remove bones from 2 tones on right foot-[facility] 06/2014  BTL    Family History  Father: alive, myocardial infarction, diagnosed with HTN  Mother: alive, hypercholesterolemia, glaucoma, diagnosed with HTN  Paternal Grand Mother: deceased, Breast cancer  Maternal Grand Mother: alive, Breast cancer   Social History  General:  Tobacco use  cigarettes: Never smoked Tobacco history last updated 01/29/2016 no EXPOSURE TO PASSIVE SMOKE.  no Recreational drug use.  Exercise: yes, bikes, regularly.  Marital Status: married.  Children: 2.    Gyn History  Sexual activity currently sexually active.  Periods : every month.  LMP 01/20/16.  Birth control BTL.  Last pap smear date 10/24/15, all negative.  Last mammogram date 05/2015.  Abnormal pap smear assessed with colposcopy, years ago.  Denies H/O STD.    OB History  Number of pregnancies 2.  Pregnancy # 1 live birth, vaginal delivery, 6 lb oz.  Pregnancy # 2 live birth, vaginal delivery, 9 lbs 1 oz.     Allergies  Zinc: swelling: Allergy  sulfa: swelling: Allergy  Lisinopril: cough (11/2014)   Hospitalization/Major Diagnostic Procedure  Child birth x 2    Review of Systems  Denies fever/chills, chest pain, SOB, headaches, numbness/tingling. No h/o complication with anesthesia, bleeding disorders or blood clots.   Vital Signs  Wt 198, Wt change 2 lb, Ht 62, BMI 36.21, Pulse sitting 74, BP sitting 131/90.   Physical Examination  GENERAL:  Patient appears alert and oriented.  General Appearance: well-appearing, well-developed, no acute distress.  Speech: clear.  LUNGS:  Auscultation: no wheezing/rhonchi/rales. CTA bilaterally.  HEART:  Heart sounds: normal. RRR. no murmur.  ABDOMEN:  General: soft nontender, nondistended, no masses.  FEMALE GENITOURINARY:  Pelvic Not examined.  EXTREMITIES:  General: No edema or calf tenderness.     Assessments   1. Pre-operative clearance - Z01.818 (Primary)   2. Complex cyst of right ovary - N83.291   3. Menorrhagia with regular cycle - N92.0   Treatment  1. Complex cyst of right ovary  Notes: Plan for laparoscopic RSO, frozen section and endometrial ablation if benign. Pt is aware that TAH and LSO will be performed in the event of malignancy. Pt is also aware that there may be a need for a second surgery to remove lymph nodes for surgical staging by gyn oncology. Pt was informed that diagnosis may be benign at time of surgery but may change when a full evalution of  ovary is completed. Pt would like least intervention as possible. Pt informed of risk of bleeding, infection and injury to organs in pelvis.    Follow Up  2 Weeks post op

## 2016-02-07 NOTE — Brief Op Note (Signed)
02/07/2016  2:08 PM  PATIENT:  Denise Duarte  50 y.o. female  PRE-OPERATIVE DIAGNOSIS:  Complex Right Ovarian Cyst, Menorrhagia  POST-OPERATIVE DIAGNOSIS:  endometrioma  PROCEDURE:  Procedure(s) with comments: HYSTEROSCOPY WITH HYDROTHERMAL ABLATION (N/A) - IF FROZEN COMES BACK WITH CANCER WILL DO LAVH IF NOT HYST.HTA RIGHT SALPINGO OOPHORECTOMY WITH FROZEN SECTION (Right) UNILATERAL SALPINGECTOMY (Left)  Dilation and currettage  SURGEON:  Surgeon(s) and Role:    * Geryl RankinsEvelyn Jaquanna Ballentine, MD - Primary    * Myna HidalgoJennifer Ozan, DO - Assisting  PHYSICIAN ASSISTANT: n/a  ASSISTANTS: Technician   ANESTHESIA:   local and general  EBL:  Total I/O In: 1000 [I.V.:1000] Out: 475 [Urine:350; Blood:125]  BLOOD ADMINISTERED:none  DRAINS: Urinary Catheter (Foley)   LOCAL MEDICATIONS USED:  2% LIDOCAINE  and Amount: 10 ml  SPECIMEN:  Source of Specimen:  Right complex cyst and tube, left fallopian tube, endometrial currettings, pelvic washings.  DISPOSITION OF SPECIMEN:  PATHOLOGY  COUNTS:  YES  TOURNIQUET:  * No tourniquets in log *  DICTATION: .Other Dictation: Dictation Number [id number]  PLAN OF CARE: Discharge to home after PACU  PATIENT DISPOSITION:  PACU - hemodynamically stable.   Delay start of Pharmacological VTE agent (>24hrs) due to surgical blood loss or risk of bleeding: yes

## 2016-02-07 NOTE — OR Nursing (Signed)
Dr. Luisa HartPatrick called to OR 4 with pathology report from frozen specimen.

## 2016-02-07 NOTE — Anesthesia Preprocedure Evaluation (Addendum)
Anesthesia Evaluation  Patient identified by MRN, date of birth, ID band Patient awake    Reviewed: Allergy & Precautions, NPO status , Patient's Chart, lab work & pertinent test results  History of Anesthesia Complications Negative for: history of anesthetic complications  Airway Mallampati: II  TM Distance: >3 FB Neck ROM: Full    Dental  (+) Dental Advisory Given   Pulmonary neg pulmonary ROS,    breath sounds clear to auscultation       Cardiovascular hypertension, Pt. on medications (-) angina Rhythm:Regular Rate:Normal     Neuro/Psych negative neurological ROS     GI/Hepatic negative GI ROS, Neg liver ROS,   Endo/Other  Morbid obesity  Renal/GU negative Renal ROS     Musculoskeletal negative musculoskeletal ROS (+)   Abdominal (+) + obese,   Peds  Hematology negative hematology ROS (+)   Anesthesia Other Findings   Reproductive/Obstetrics S/p BTL                            Anesthesia Physical Anesthesia Plan  ASA: II  Anesthesia Plan: General   Post-op Pain Management:    Induction: Intravenous  Airway Management Planned: Oral ETT  Additional Equipment:   Intra-op Plan:   Post-operative Plan: Extubation in OR  Informed Consent: I have reviewed the patients History and Physical, chart, labs and discussed the procedure including the risks, benefits and alternatives for the proposed anesthesia with the patient or authorized representative who has indicated his/her understanding and acceptance.   Dental advisory given  Plan Discussed with: CRNA and Surgeon  Anesthesia Plan Comments: (Plan routine monitors, GETA)        Anesthesia Quick Evaluation

## 2016-02-07 NOTE — Anesthesia Procedure Notes (Signed)
Procedure Name: Intubation Date/Time: 02/07/2016 11:49 AM Performed by: Shanon PayorGREGORY, Rissie Sculley M Pre-anesthesia Checklist: Patient identified, Emergency Drugs available, Suction available, Patient being monitored and Timeout performed Patient Re-evaluated:Patient Re-evaluated prior to inductionOxygen Delivery Method: Circle system utilized Preoxygenation: Pre-oxygenation with 100% oxygen Intubation Type: IV induction Ventilation: Mask ventilation without difficulty Laryngoscope Size: Mac and 3 Grade View: Grade II Tube type: Oral Tube size: 7.0 mm Number of attempts: 1 Airway Equipment and Method: Stylet Placement Confirmation: ETT inserted through vocal cords under direct vision,  breath sounds checked- equal and bilateral and positive ETCO2 Secured at: 22 cm Tube secured with: Tape Dental Injury: Teeth and Oropharynx as per pre-operative assessment

## 2016-02-07 NOTE — Anesthesia Postprocedure Evaluation (Signed)
Anesthesia Post Note  Patient: Denise Duarte  Procedure(s) Performed: Procedure(s) (LRB): HYSTEROSCOPY WITH HYDROTHERMAL ABLATION (N/A) RIGHT SALPINGO OOPHORECTOMY WITH FROZEN SECTION (Right) UNILATERAL SALPINGECTOMY (Left)  Patient location during evaluation: PACU Anesthesia Type: General Level of consciousness: awake and alert, oriented and patient cooperative Pain management: pain level controlled Vital Signs Assessment: post-procedure vital signs reviewed and stable Respiratory status: spontaneous breathing, nonlabored ventilation and respiratory function stable Cardiovascular status: blood pressure returned to baseline and stable Postop Assessment: no signs of nausea or vomiting Anesthetic complications: no     Last Vitals:  Filed Vitals:   02/07/16 1012  BP: 143/98  Pulse: 76  Temp: 36.7 C  Resp: 18    Last Pain: There were no vitals filed for this visit. Pain Goal: Patients Stated Pain Goal: 1 (02/07/16 1012)               Mickayla Trouten,E. Basem Yannuzzi

## 2016-02-10 ENCOUNTER — Encounter (HOSPITAL_COMMUNITY): Payer: Self-pay | Admitting: Obstetrics and Gynecology

## 2016-02-12 NOTE — Op Note (Signed)
NAMEFRANK, Denise Duarte NO.:  [id number]  MEDICAL RECORD NO.:  [id number]  LOCATION:  [facility]                          FACILITY:  [facility]  PHYSICIAN:  Denise Partridge, MD   DATE OF BIRTH:  Aug 04, 1965  DATE OF PROCEDURE:  02/07/2016 DATE OF DISCHARGE:  02/07/2016                              OPERATIVE REPORT   PREOPERATIVE DIAGNOSES:  Complex right ovarian cyst.  Menorrhagia and fibroids.  POSTOPERATIVE DIAGNOSIS:  Likely endometrioma.  PROCEDURE:  Right salpingo-oophorectomy, left salpingectomy, and endometrial ablation via HTA via hysteroscopy, frozen-section, dilation and curettage  SURGEON:  Denise Gobert B. Dion Body, MD  ASSISTANT:  Dr. Myna Duarte, which she was requested due to suspected complexity of the case and adhesions and anticipated LAVH if mass was cancerous.  OTHER ASSISTANT:  Pensions consultant.  ANESTHESIA:  Local and general.  ESTIMATED BLOOD LOSS:  125.  URINE:  350.  DRAINS:  Foley catheter.  LOCAL:  2% lidocaine 10 mL.  SPECIMEN:  Right complex cyst and tube, left fallopian tube, and endometrial curettings, and pelvic washings.  Disposition of specimens to Pathology.  DISPOSITION:  To PACU, hemodynamically stable.  COMPLICATIONS:  None.  FINDINGS:  Adhesed, firm, right ovarian mass fixed to the pelvic side wall.  Chocolate cyst was noted.  Right ovary appeared normal.  Left fallopian tube is normal.  The right fallopian tube was encased in the endometrioma.  Uterus appeared retroverted with some small fibroids. The liver edge was normal.  No other abdominal adhesions and no ascites.  PROCEDURE IN DETAIL:  [name] was identified in the holding area. She was then taken to the operating room.  She underwent general endotracheal anesthesia in the dorsal lithotomy position.  She was prepped and draped in normal sterile fashion.  She received Ancef prior to the surgery.  SCDs were on and operating.  A time-out was performed. The Hulka  uterine manipulator was inserted by placing the speculum in the vagina and visualizing the cervix and grasping it with a single- tooth tenaculum.  Attention was turned to the umbilicus.  A 10 mm incision was made at the umbilicus after injecting with Marcaine.  The anterior abdominal access was confirmed.  A 2 mm trocar was advanced with a laparoscoped under direct visualization.  We were then placed two0 other 5 mm trocars after injecting Marcaine under direct visualization after transilluminating the abdomen.  The patient was placed in Trendelenburg.  A blunt probe was used to explore the abdomen.  The mass was pretty firm and fixed.  So, it was transected using the Harmonic Scalpel.  We were able to visualize the ureters, but prior to making any incision, we did we did not see peristalsis prior to the incision nor did we see it afterwards, but we were pretty confident that we saw the ureter and that was well below the mass.  The mass was transected gradually and slowly off the right pelvic sidewall and it was on the portion of the uterus.  The round ligament was transected to visualize the extent to which the mass was by the uterus.  It was eventually resected.  It was opened just a little bit, which the chocolate contents were noted.  The mass was removed after the fascial incision was extended and it was sent to Pathology for frozen section.  While we were awaiting to have frozen section, the Harmonic Scalpel was used to transect the left fallopian tube.  The uterus appeared to be normal.  Once we received word that the mass was likely benign, we put some Arista on the pelvic sidewall for bleeding and make sure all pedicles were dry.  All trocars were removed under direct visualization.  The fascia was then reapproximated with 0 Vicryl in a continuous running fashion using the S-retractors and Kochers to visualize the fascial edges.  The palpation of the incision confirmed  closure.  All instruments were removed.  The umbilical incision was closed in a subcuticular fashion with a 4-0 Monocryl.  One suture was placed in the lower abdomen also with the 4-0 Monocryl, and they were dressed appropriately.  Attention was then turned to the pelvis.  The Hulka uterine manipulator was removed.  The Graves speculum was inserted.  The cervix was dilated up to 24.  The hysteroscope was advanced of note, the endocervical canal angled up into the patient's left.  Once we were incised the uterus, we were able to visualize the cavity.  There were no obvious mass.  The HTA ablation was activated and proceeded without complication.  Once that was completed, a curettage of all 4 quadrants of the uterus was completed with return of some tissue and that will be sent to Pathology. Single-tooth tenaculum was removed.  Hemostatic tenaculum site.  All instruments were removed from the vagina.  Foley catheter will be removed prior to the patient going to the recovery room.  All instrument, sponge, and needle counts were correct x3.  The patient tolerated the procedure well.  She was taken to the recovery room in stable condition.     Denise PartridgeEvelyn B Jamas Jaquay, MD    EBV/MEDQ  D:  02/12/2016  T:  02/12/2016  Job:  [id number]

## 2019-02-22 ENCOUNTER — Other Ambulatory Visit (HOSPITAL_COMMUNITY)
Admission: RE | Admit: 2019-02-22 | Discharge: 2019-02-22 | Disposition: A | Discharge status: Home / Self Care | Payer: BC Managed Care – PPO | Source: Ambulatory Visit | Attending: Obstetrics and Gynecology | Admitting: Obstetrics and Gynecology

## 2019-02-22 ENCOUNTER — Other Ambulatory Visit: Payer: Self-pay | Admitting: Obstetrics and Gynecology

## 2019-02-22 DIAGNOSIS — Z124 Encounter for screening for malignant neoplasm of cervix: Secondary | ICD-10-CM | POA: Diagnosis present

## 2019-02-24 LAB — CYTOLOGY - PAP
Adequacy: ABSENT
Diagnosis: NEGATIVE
HPV: NOT DETECTED

## 2020-04-24 ENCOUNTER — Telehealth: Payer: Self-pay

## 2020-04-24 NOTE — Telephone Encounter (Signed)
Pt LVM trying to schedule a New patient appt. LVM for pt to call the office back

## 2021-11-21 ENCOUNTER — Other Ambulatory Visit: Payer: Self-pay | Admitting: Internal Medicine

## 2021-11-21 ENCOUNTER — Ambulatory Visit
Admission: RE | Admit: 2021-11-21 | Discharge: 2021-11-21 | Disposition: A | Discharge status: Home / Self Care | Payer: BC Managed Care – PPO | Source: Ambulatory Visit | Attending: Internal Medicine | Admitting: Internal Medicine

## 2021-11-21 DIAGNOSIS — M79672 Pain in left foot: Secondary | ICD-10-CM

## 2021-12-14 NOTE — Progress Notes (Addendum)
Cardiology Office Note:    Date:  12/15/2021   ID:  TAMECKA MCALPINE, DOB 1965/09/07, MRN [id number]  PCP:  Renford Dills, MD   [facility] Providers Cardiologist:  Alverda Skeans, MD Referring MD: Renford Dills, MD   Chief Complaint/Reason for Referral: Syncope  ASSESSMENT:    1. Syncope and collapse   2. Hypertension, unspecified type   3. BMI 40.0-44.9, adult (HCC)     PLAN:    In order of problems listed above: 1.  Syncope: Given the patient's abdominal discomfort and nausea prior to the episode, I agree with the patient's primary care provider that this represents vasovagal syncope.  She had an echocardiogram which reportedly demonstrated normal ejection fraction and no significant valvular abnormalities.  I will obtain a monitor to evaluate further.  I will keep follow-up with me open-ended depending on the results of this testing 2.  Hypertension: Blood pressures well controlled on her current regimen 3.  Elevated BMI: Consider pharmacotherapy.  This will be managed by the patient's primary care provider.  I encouraged diet and exercise modification.   Dispo:  Return if symptoms worsen or fail to improve.      Medication Adjustments/Labs and Tests Ordered: Current medicines are reviewed at length with the patient today.  Concerns regarding medicines are outlined above.  The following changes have been made:  no change   Labs/tests ordered: Orders Placed This Encounter  Procedures   LONG TERM MONITOR (3-14 DAYS)   EKG 12-Lead    Medication Changes: No orders of the defined types were placed in this encounter.    Current medicines are reviewed at length with the patient today.  The patient does not have concerns regarding medicines.   History of Present Illness:    FOCUSED PROBLEM LIST:   1.  Hypertension 2.  ACE inhibitor cough 5.  BMI of 41  The patient is a 56 y.o. female with the indicated medical history here for recommendations regarding  syncope.  The patient was seen by her primary care provider recently.  An episode after eating where she became very nauseous and felt quite faint.  This happened after she ate some wings from [employer].  She felt very nauseous but did not vomit.  She also had abdominal discomfort.  Echocardiogram done at the patient's primary care provider office as well as an EKG were reassuring.  She had labs drawn as well which were reassuring.  She is referred for further recommendations.  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  This has been very stressful and she has erratic sleep patterns.  She however denies any chest pain, exertional angina, palpitations, paroxysmal nocturnal dyspnea, orthopnea.  She used to exercise on a regular basis but no longer does so.  She would like to get back to this.  She does not smoke or drink.  She is otherwise well without complaints.         Current Medications: Current Meds  Medication Sig   dorzolamide-timolol (COSOPT) 22.3-6.8 MG/ML ophthalmic solution 1 drop 2 (two) times daily.   ibuprofen (ADVIL,MOTRIN) 600 MG tablet Take 1 tablet (600 mg total) by mouth every 6 (six) hours as needed.   oxyCODONE-acetaminophen (PERCOCET/ROXICET) 5-325 MG tablet Take 1-2 tablets by mouth every 4 (four) hours as needed for severe pain.   valsartan-hydrochlorothiazide (DIOVAN-HCT) 160-25 MG tablet Take 1 tablet by mouth daily.     Allergies:  Lisinopril and Zinc   Social History:   Social History   Tobacco Use   Smoking status: Never   Smokeless tobacco: Never  Substance Use Topics   Alcohol use: No   Drug use: No     Family Hx: History reviewed. No pertinent family history.   Review of Systems:   Please see the history of present illness.    All other systems reviewed and are negative.     EKGs/Labs/Other Test Reviewed:    EKG:  EKG performed 2017 that I personally reviewed demonstrates  sinus rhythm; EKG performed today that I personally reviewed demonstrates NSR with NSTT changes.  Prior CV studies: None available  Other studies Reviewed: Review of the additional studies/records demonstrates: None available  Recent Labs: External labs done recently demonstrate sodium 141, potassium 3.5, creatinine 0.89, hemoglobin 13.2  Risk Assessment/Calculations:           Physical Exam:    VS:  BP 118/72   Pulse 60   Ht 5\' 2"  (1.575 m)   Wt 221 lb 6.4 oz (100.4 kg)   SpO2 99%   BMI 40.49 kg/m    Wt Readings from Last 3 Encounters:  12/15/21 221 lb 6.4 oz (100.4 kg)  01/27/16 199 lb (90.3 kg)    GENERAL:  No apparent distress, AOx3 HEENT:  No carotid bruits, +2 carotid impulses, no scleral icterus CAR: RRR no murmurs, gallops, rubs, or thrills RES:  Clear to auscultation bilaterally ABD:  Soft, nontender, nondistended, positive bowel sounds x 4 VASC:  +2 radial pulses, +2 carotid pulses, palpable pedal pulses NEURO:  CN 2-12 grossly intact; motor and sensory grossly intact PSYCH:  No active depression or anxiety EXT:  No edema, ecchymosis, or cyanosis  Signed, Early Osmond, MD  12/15/2021 9:26 AM    [address], [place], [place]  [address] Phone: [phone]; Fax: [phone]   Note:  This document was prepared using Dragon voice recognition software and may include unintentional dictation errors.

## 2021-12-15 ENCOUNTER — Encounter (INDEPENDENT_AMBULATORY_CARE_PROVIDER_SITE_OTHER): Payer: Self-pay

## 2021-12-15 ENCOUNTER — Ambulatory Visit (INDEPENDENT_AMBULATORY_CARE_PROVIDER_SITE_OTHER): Payer: BC Managed Care – PPO

## 2021-12-15 ENCOUNTER — Ambulatory Visit: Payer: BC Managed Care – PPO | Admitting: Internal Medicine

## 2021-12-15 VITALS — BP 118/72 | HR 60 | Ht 62.0 in | Wt 221.4 lb

## 2021-12-15 DIAGNOSIS — I10 Essential (primary) hypertension: Secondary | ICD-10-CM

## 2021-12-15 DIAGNOSIS — R55 Syncope and collapse: Secondary | ICD-10-CM | POA: Diagnosis not present

## 2021-12-15 DIAGNOSIS — Z6841 Body Mass Index (BMI) 40.0 and over, adult: Secondary | ICD-10-CM

## 2021-12-15 NOTE — Progress Notes (Unsigned)
Enrolled for [employer] to mail a ZIO XT long term holter monitor to the patients address on file.  

## 2021-12-15 NOTE — Patient Instructions (Signed)
Medication Instructions:  Your physician recommends that you continue on your current medications as directed. Please refer to the Current Medication list given to you today.  *If you need a refill on your cardiac medications before your next appointment, please call your pharmacy*   Lab Work: none If you have labs (blood work) drawn today and your tests are completely normal, you will receive your results only by: MyChart Message (if you have MyChart) OR A paper copy in the mail If you have any lab test that is abnormal or we need to change your treatment, we will call you to review the results.   Testing/Procedures: Dr Lynnette Caffey recommends you wear a 3 day monitor   Follow-Up: At [facility], you and your health needs are our priority.  As part of our continuing mission to provide you with exceptional heart care, we have created designated Provider Care Teams.  These Care Teams include your primary Cardiologist (physician) and Advanced Practice Providers (APPs -  Physician Assistants and Nurse Practitioners) who all work together to provide you with the care you need, when you need it.  We recommend signing up for the patient portal called "MyChart".  Sign up information is provided on this After Visit Summary.  MyChart is used to connect with patients for Virtual Visits (Telemedicine).  Patients are able to view lab/test results, encounter notes, upcoming appointments, etc.  Non-urgent messages can be sent to your provider as well.   To learn more about what you can do with MyChart, go to [email or web address].    Your next appointment:   As needed  The format for your next appointment:   In Person  Provider:   Orbie Pyo, MD     Other Instructions Denise Duarte- Long Term Monitor Instructions  Your physician has requested you wear a ZIO patch monitor for 3 days.  This is a single patch monitor. [employer] supplies one patch monitor per enrollment. Additional stickers are not  available. Please do not apply patch if you will be having a Nuclear Stress Test,  Echocardiogram, Cardiac CT, MRI, or Chest Xray during the period you would be wearing the  monitor. The patch cannot be worn during these tests. You cannot remove and re-apply the  ZIO XT patch monitor.  Your ZIO patch monitor will be mailed 3 day USPS to your address on file. It may take 3-5 days  to receive your monitor after you have been enrolled.  Once you have received your monitor, please review the enclosed instructions. Your monitor  has already been registered assigning a specific monitor serial # to you.  Billing and Patient Assistance Program Information  We have supplied [employer] with any of your insurance information on file for billing purposes. [employer] offers a sliding scale Patient Assistance Program for patients that do not have  insurance, or whose insurance does not completely cover the cost of the ZIO monitor.  You must apply for the Patient Assistance Program to qualify for this discounted rate.  To apply, please call [employer] at [phone], select option 4, select option 2, ask to apply for  Patient Assistance Program. Denise Duarte will ask your household income, and how many people  are in your household. They will quote your out-of-pocket cost based on that information.  [employer] will also be able to set up a 5-month, interest-free payment plan if needed.  Applying the monitor   Shave hair from upper left chest.  Hold abrader disc by orange tab. Rub abrader in  40 strokes over the upper left chest as  indicated in your monitor instructions.  Clean area with 4 enclosed alcohol pads. Let dry.  Apply patch as indicated in monitor instructions. Patch will be placed under collarbone on left  side of chest with arrow pointing upward.  Rub patch adhesive wings for 2 minutes. Remove white label marked "1". Remove the white  label marked "2". Rub patch adhesive wings for 2 additional minutes.   While looking in a mirror, press and release button in center of patch. A small green light will  flash 3-4 times. This will be your only indicator that the monitor has been turned on.  Do not shower for the first 24 hours. You may shower after the first 24 hours.  Press the button if you feel a symptom. You will hear a small click. Record Date, Time and  Symptom in the Patient Logbook.  When you are ready to remove the patch, follow instructions on the last 2 pages of Patient  Logbook. Stick patch monitor onto the last page of Patient Logbook.  Place Patient Logbook in the blue and white box. Use locking tab on box and tape box closed  securely. The blue and white box has prepaid postage on it. Please place it in the mailbox as  soon as possible. Your physician should have your test results approximately 7 days after the  monitor has been mailed back to [facility].  Call [facility] at [phone] if you have questions regarding  your ZIO XT patch monitor. Call them immediately if you see an orange light blinking on your  monitor.  If your monitor falls off in less than 4 days, contact our Monitor department at [phone].  If your monitor becomes loose or falls off after 4 days call [employer] at [phone] for  suggestions on securing your monitor   Important Information About [employer]

## 2021-12-16 ENCOUNTER — Encounter: Payer: Self-pay | Admitting: *Deleted

## 2021-12-18 ENCOUNTER — Telehealth: Payer: Self-pay | Admitting: Internal Medicine

## 2021-12-18 NOTE — Telephone Encounter (Signed)
Patient calling because she has not received the monitor, but it said it was delivered. She states the address listed was correct.

## 2021-12-18 NOTE — Telephone Encounter (Signed)
According to USPS tracking, her monitor should be delivered today between 10:30 AM-2:30 PM.

## 2021-12-25 ENCOUNTER — Telehealth: Payer: Self-pay | Admitting: *Deleted

## 2021-12-25 DIAGNOSIS — R55 Syncope and collapse: Secondary | ICD-10-CM

## 2021-12-25 NOTE — Telephone Encounter (Signed)
Patient is calling about getting help on putting on heart monitor

## 2021-12-25 NOTE — Telephone Encounter (Signed)
Patient scheduled to have monitor applied at [facility], Thursday , 12/25/21, at 3:15-3:30PM.

## 2022-01-05 ENCOUNTER — Telehealth: Payer: Self-pay | Admitting: Internal Medicine

## 2022-01-05 NOTE — Telephone Encounter (Signed)
Left message for patient to call back.  Monitor report has not been reviewed by MD as of this time.

## 2022-01-05 NOTE — Telephone Encounter (Signed)
Pt is requesting call back in regards to heart monitor results. 

## 2022-01-06 ENCOUNTER — Telehealth: Payer: Self-pay | Admitting: *Deleted

## 2022-01-06 NOTE — Telephone Encounter (Signed)
Patient returning call. Dr. Lynnette Caffey has reviewed results. Patient requests to make sure the call goes to her cell phone not her home number. Phone: [phone]

## 2022-01-06 NOTE — Telephone Encounter (Addendum)
I spoke with this patient when her call was transferred to me.  She said she is not getting missed calls from me nor VM messages.  Pt told me that she left her mobile number specifically for me to call her and she doesn't know how else to speak with an adult about following directions.  She is paying money for a service and she was driving to the office next which she should not have to do to get results.  I explained the results of her monitor of which she voices understanding.  She asked about the leaky valve Dr. Nehemiah Settle told her about.  I reviewed the echo in the referral notes that indicated mild tricuspid regurgitation.  I explained this is monitored over time. She stated that this was discussed at the office visit with Dr. Lynnette Caffey and she needs to have a plan for her heart valve and will not sit around and wait for it to get worse.   She wants input from Dr. Lynnette Caffey as soon as possible.  I explained that I would send him a message to review and provide any input/recommendations but that he is away until at least next Monday and may not be able to respond before that time.    She did not wish to make an appointment.  "Why should I make another appointment and pay again when I already discussed this with him at the last visit"

## 2022-01-06 NOTE — Telephone Encounter (Signed)
Left detailed message of monitor result from Dr. Lynnette Caffey and to call back if any questions.

## 2022-01-12 NOTE — Telephone Encounter (Signed)
Message from Dr. Lynnette Caffey: Regular follow up and BP control.

## 2022-01-12 NOTE — Telephone Encounter (Signed)
Patient made aware of Dr. Trula Ore recommendations. Instructed for patient to let us know if she develops any Sx or if her BP is not well controlled.

## 2023-03-10 ENCOUNTER — Ambulatory Visit (HOSPITAL_COMMUNITY)
Admission: RE | Admit: 2023-03-10 | Discharge: 2023-03-10 | Disposition: A | Discharge status: Home / Self Care | Payer: BC Managed Care – PPO | Source: Ambulatory Visit | Attending: Cardiology | Admitting: Cardiology

## 2023-03-10 ENCOUNTER — Other Ambulatory Visit (HOSPITAL_COMMUNITY): Payer: Self-pay | Admitting: Internal Medicine

## 2023-03-10 DIAGNOSIS — M79669 Pain in unspecified lower leg: Secondary | ICD-10-CM | POA: Insufficient documentation

## 2023-03-10 DIAGNOSIS — M7989 Other specified soft tissue disorders: Secondary | ICD-10-CM

## 2023-05-06 IMAGING — CR DG FOOT COMPLETE 3+V*L*
3 series · 3 of 3 positions shown · non-contrast
Comparison: None.

CLINICAL DATA: Left foot pain at posterior calcaneus, status post
fall yesterday.

EXAM:
LEFT FOOT - COMPLETE 3+ VIEW

[t foot ap left]
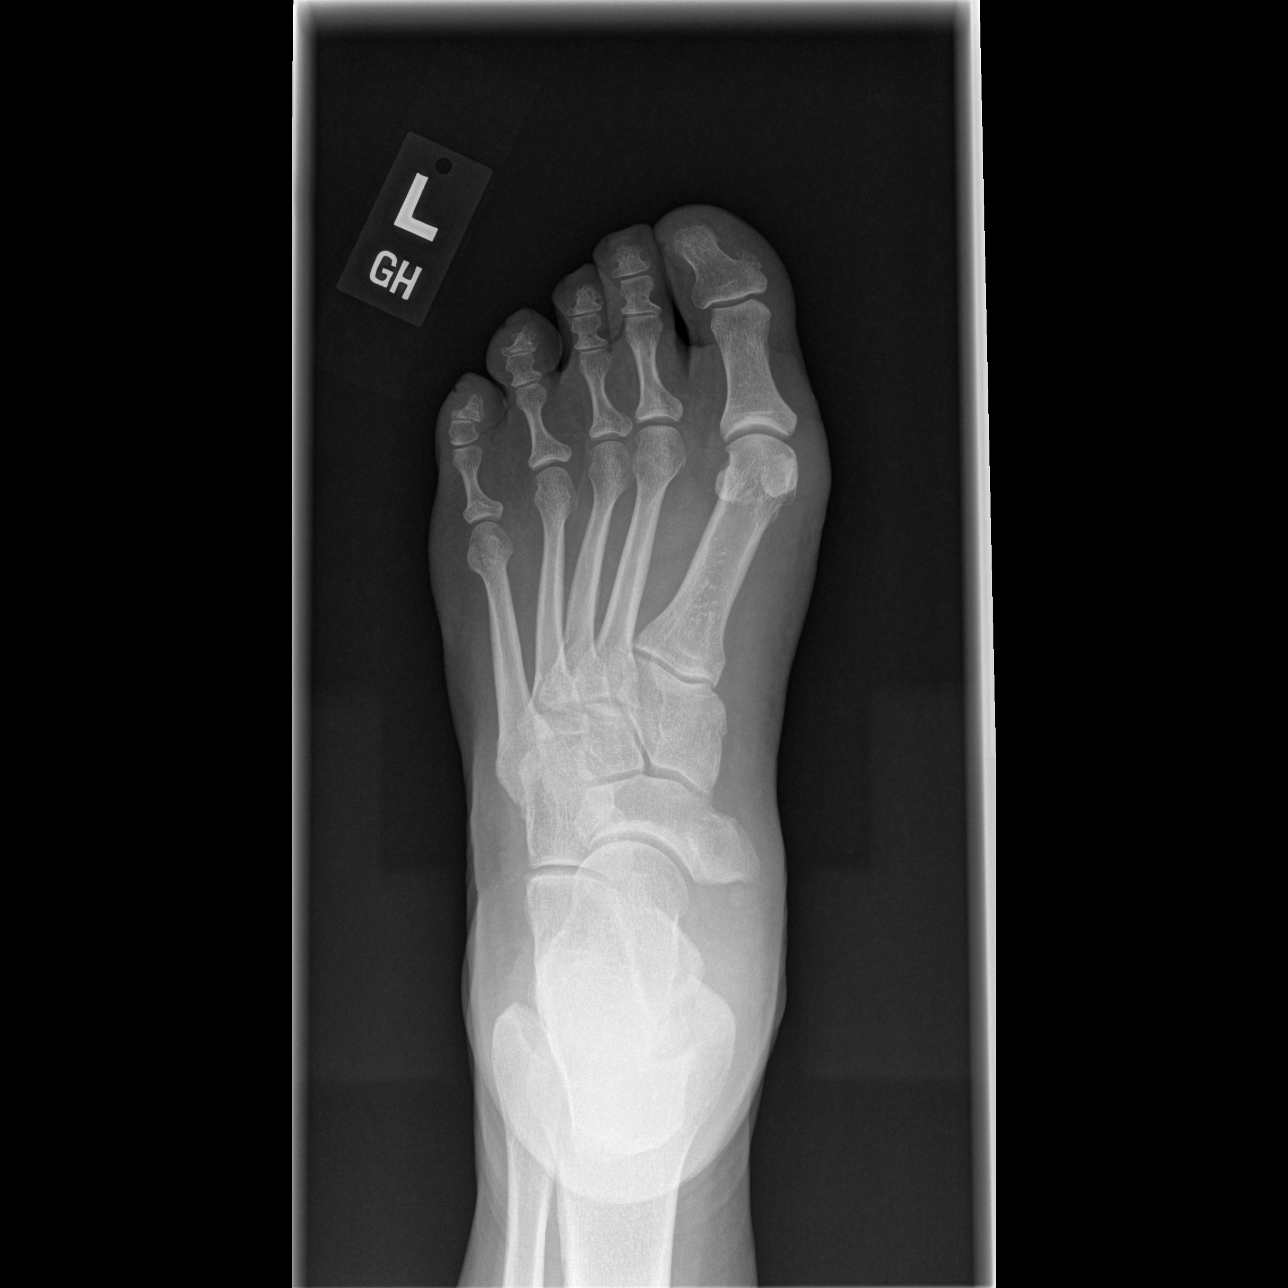

[t foot oblique left]
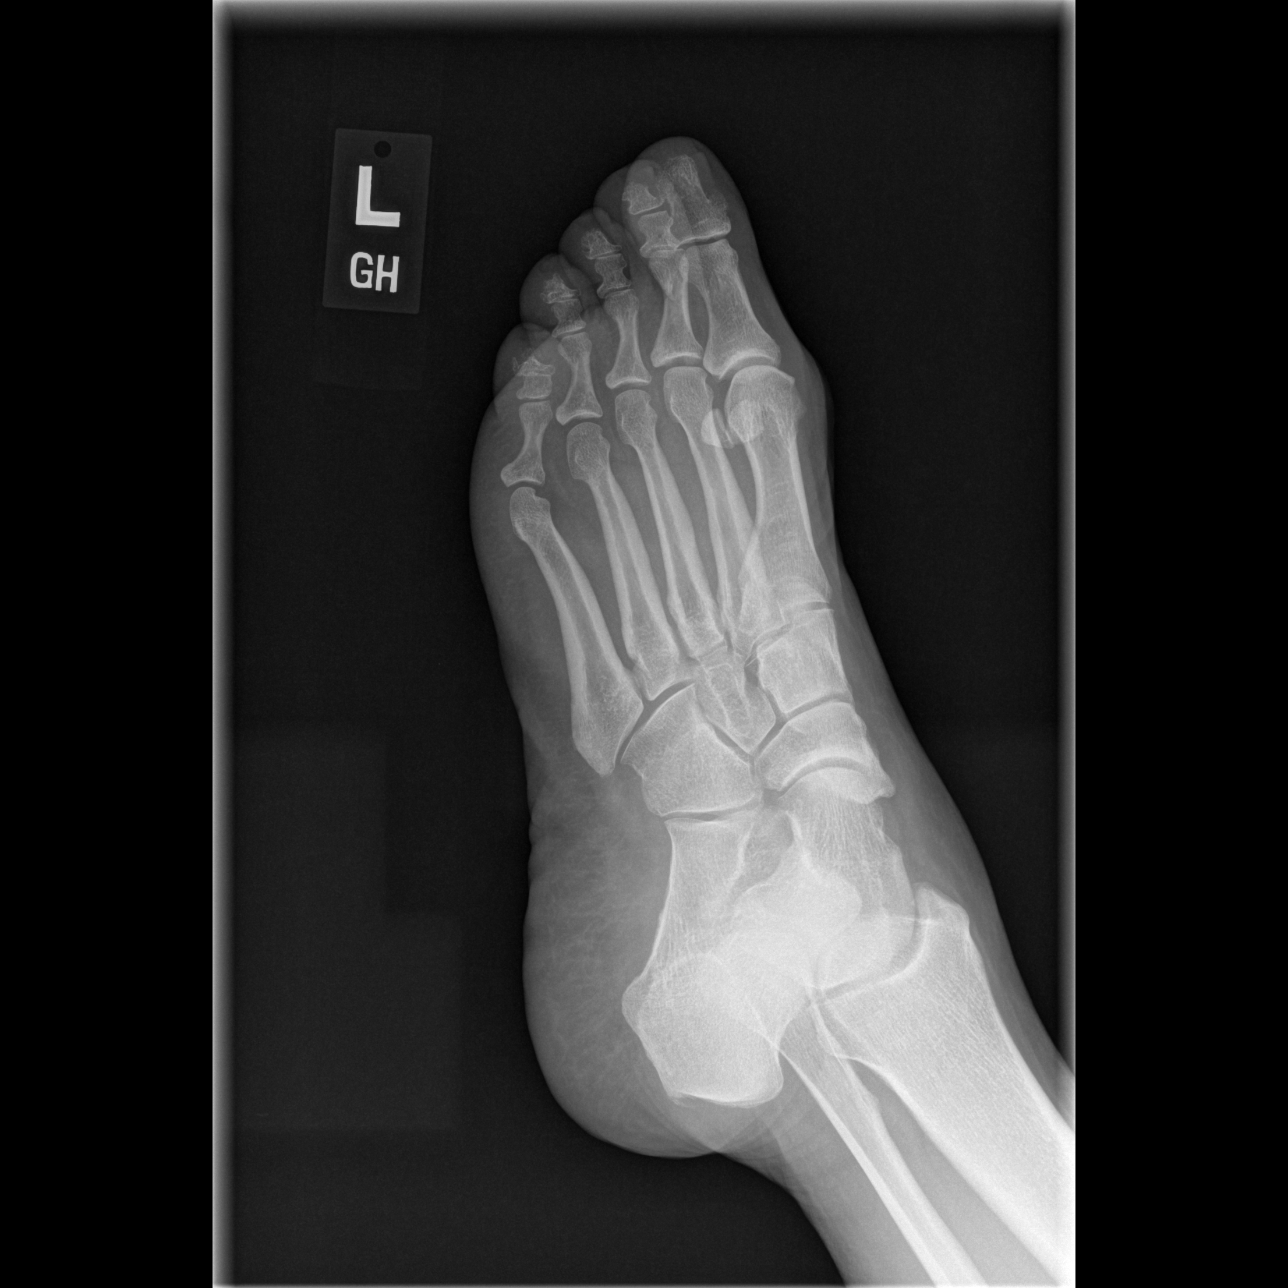

[t foot lat left]
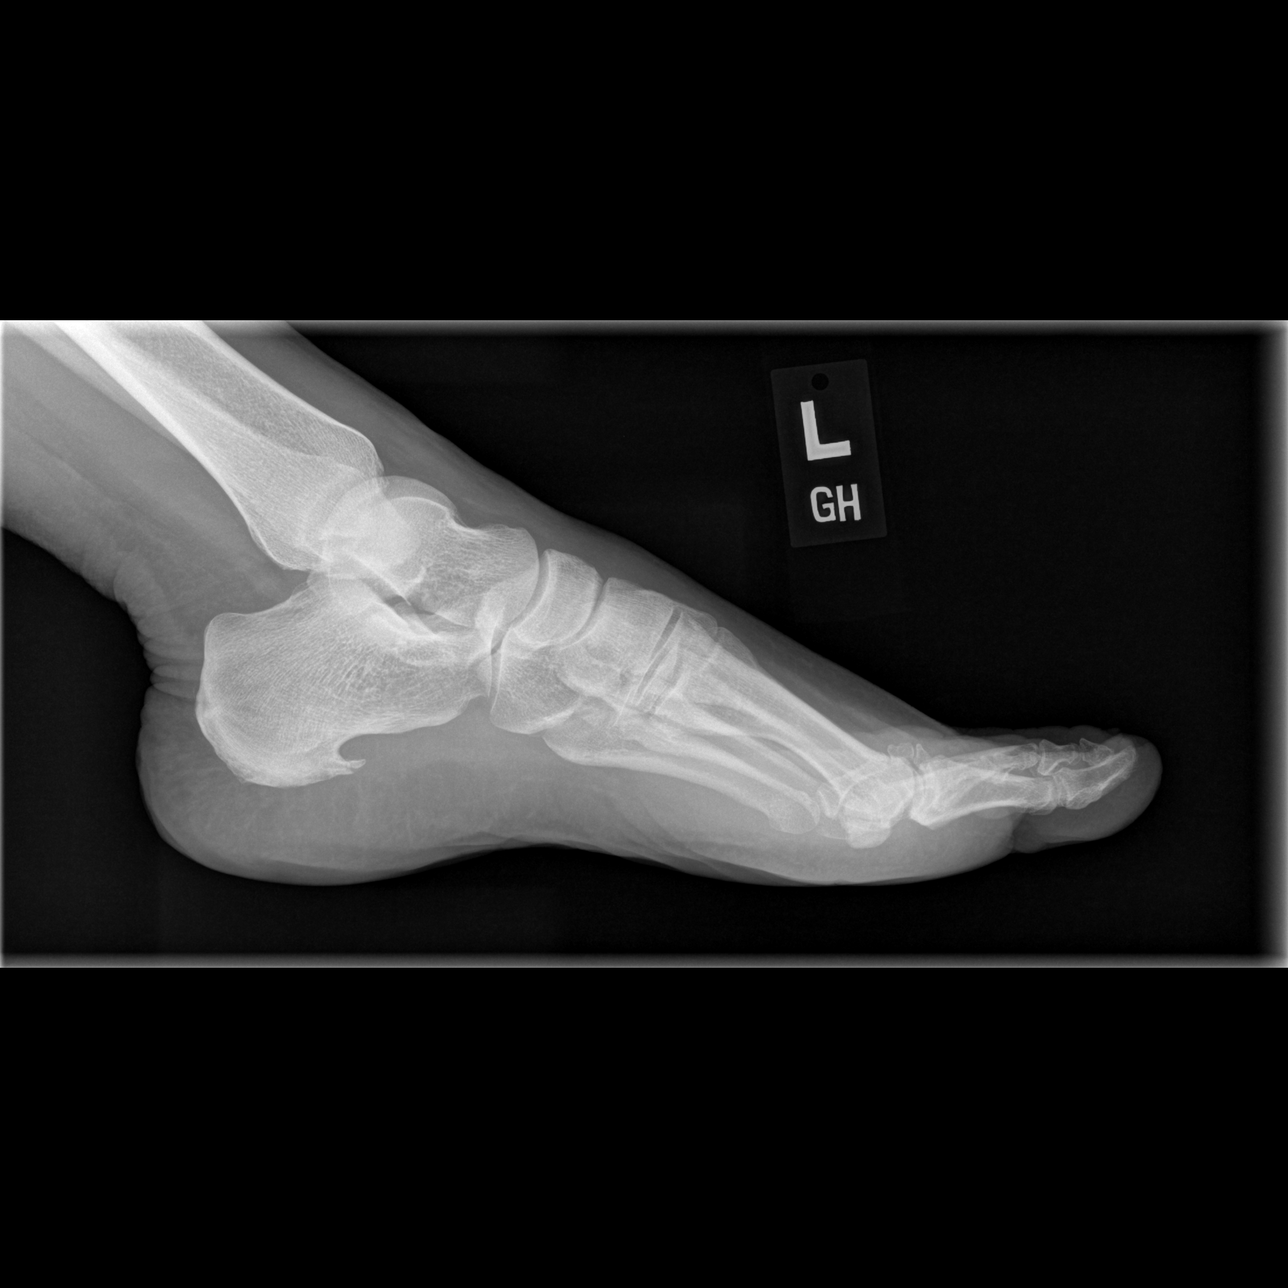

[3 of 3 positions shown; findings below may reference images not displayed]

FINDINGS: There is no evidence for acute fracture or dislocation. Joint spaces
are well maintained. Soft tissues are within normal limits. Inferior
calcaneal spur is present. There is mild hallux valgus. Os navicular
is noted.
IMPRESSION: 1. No acute bony abnormality.
